# Patient Record
Sex: Male | Born: 1970 | Race: Black or African American | Hispanic: No | Marital: Married | Smoking: Current every day smoker
Health system: Southern US, Community
[De-identification: ages and names within clinical notes are randomized; demographics above are authoritative.]

---

## 2004-10-30 ENCOUNTER — Emergency Department: Payer: Self-pay | Admitting: Unknown Physician Specialty

## 2004-11-26 ENCOUNTER — Emergency Department: Payer: Self-pay | Admitting: Emergency Medicine

## 2005-08-06 ENCOUNTER — Emergency Department: Payer: Self-pay | Admitting: Emergency Medicine

## 2007-07-25 ENCOUNTER — Emergency Department: Payer: Self-pay | Admitting: Emergency Medicine

## 2008-12-15 ENCOUNTER — Emergency Department: Payer: Self-pay | Admitting: Emergency Medicine

## 2012-02-11 ENCOUNTER — Emergency Department: Payer: Self-pay | Admitting: Emergency Medicine

## 2014-08-21 ENCOUNTER — Emergency Department: Payer: Self-pay | Admitting: Emergency Medicine

## 2019-02-13 ENCOUNTER — Other Ambulatory Visit: Payer: Self-pay

## 2019-02-13 ENCOUNTER — Emergency Department: Payer: BLUE CROSS/BLUE SHIELD

## 2019-02-13 ENCOUNTER — Emergency Department
Admission: EM | Admit: 2019-02-13 | Discharge: 2019-02-13 | Disposition: A | Discharge status: Home / Self Care | Payer: BLUE CROSS/BLUE SHIELD | Attending: Emergency Medicine | Admitting: Emergency Medicine

## 2019-02-13 DIAGNOSIS — K2970 Gastritis, unspecified, without bleeding: Secondary | ICD-10-CM | POA: Diagnosis not present

## 2019-02-13 DIAGNOSIS — R1013 Epigastric pain: Secondary | ICD-10-CM | POA: Diagnosis present

## 2019-02-13 LAB — COMPREHENSIVE METABOLIC PANEL
ALT: 42 U/L (ref 0–44)
AST: 25 U/L (ref 15–41)
Albumin: 4 g/dL (ref 3.5–5.0)
Alkaline Phosphatase: 59 U/L (ref 38–126)
Anion gap: 7 (ref 5–15)
BUN: 18 mg/dL (ref 6–20)
CO2: 25 mmol/L (ref 22–32)
Calcium: 9.1 mg/dL (ref 8.9–10.3)
Chloride: 108 mmol/L (ref 98–111)
Creatinine, Ser: 0.95 mg/dL (ref 0.61–1.24)
GFR calc Af Amer: >60 mL/min (ref >60)
GFR calc non Af Amer: >60 mL/min (ref >60)
Glucose, Bld: 177 mg/dL — ABNORMAL HIGH (ref 70–99)
POTASSIUM: 3.7 mmol/L (ref 3.5–5.1)
SODIUM: 140 mmol/L (ref 135–145)
Total Bilirubin: 0.7 mg/dL (ref 0.3–1.2)
Total Protein: 7.5 g/dL (ref 6.5–8.1)

## 2019-02-13 LAB — CBC WITH DIFFERENTIAL/PLATELET
Abs Immature Granulocytes: 0.21 10*3/uL — ABNORMAL HIGH (ref 0.00–0.07)
Basophils Absolute: 0.1 10*3/uL (ref 0.0–0.1)
Basophils Relative: 1 %
Eosinophils Absolute: 0.5 10*3/uL (ref 0.0–0.5)
Eosinophils Relative: 4 %
HCT: 49 % (ref 39.0–52.0)
Hemoglobin: 16 g/dL (ref 13.0–17.0)
Immature Granulocytes: 1 %
Lymphocytes Relative: 36 %
Lymphs Abs: 5.4 10*3/uL — ABNORMAL HIGH (ref 0.7–4.0)
MCH: 29.6 pg (ref 26.0–34.0)
MCHC: 32.7 g/dL (ref 30.0–36.0)
MCV: 90.6 fL (ref 80.0–100.0)
Monocytes Absolute: 0.9 10*3/uL (ref 0.1–1.0)
Monocytes Relative: 6 %
NEUTROS ABS: 8.1 10*3/uL — AB (ref 1.7–7.7)
Neutrophils Relative %: 52 %
Platelets: 185 10*3/uL (ref 150–400)
RBC: 5.41 MIL/uL (ref 4.22–5.81)
RDW: 13.9 % (ref 11.5–15.5)
WBC: 15.1 10*3/uL — ABNORMAL HIGH (ref 4.0–10.5)
nRBC: 0 % (ref 0.0–0.2)

## 2019-02-13 LAB — LIPASE, BLOOD: Lipase: 24 U/L (ref 11–51)

## 2019-02-13 LAB — ETHANOL: Alcohol, Ethyl (B): <10 mg/dL (ref <10)

## 2019-02-13 MED ORDER — SUCRALFATE 1 G PO TABS: 1.0000 g | ORAL_TABLET | Freq: Four times a day (QID) | ORAL | 1 refills | Status: DC

## 2019-02-13 MED ORDER — IOHEXOL 300 MG/ML  SOLN
100.0000 mL | Freq: Once | INTRAMUSCULAR | Status: AC | PRN
  Administered 2019-02-13: 100 mL via INTRAVENOUS

## 2019-02-13 MED ORDER — HYDROMORPHONE HCL 1 MG/ML IJ SOLN
1.0000 mg | Freq: Once | INTRAMUSCULAR | Status: AC
  Administered 2019-02-13: 1 mg via INTRAVENOUS
  Filled 2019-02-13: qty 1

## 2019-02-13 MED ORDER — FAMOTIDINE IN NACL 20-0.9 MG/50ML-% IV SOLN
20.0000 mg | Freq: Once | INTRAVENOUS | Status: AC
  Administered 2019-02-13: 20 mg via INTRAVENOUS
  Filled 2019-02-13: qty 50

## 2019-02-13 MED ORDER — OXYCODONE HCL 5 MG PO TABS: 5.0000 mg | ORAL_TABLET | Freq: Three times a day (TID) | ORAL | 0 refills | Status: AC | PRN

## 2019-02-13 MED ORDER — FAMOTIDINE 20 MG PO TABS: 20.0000 mg | ORAL_TABLET | Freq: Two times a day (BID) | ORAL | 1 refills | Status: DC

## 2019-02-13 MED ORDER — HYDROMORPHONE HCL 1 MG/ML IJ SOLN
0.5000 mg | Freq: Once | INTRAMUSCULAR | Status: AC
  Administered 2019-02-13: 0.5 mg via INTRAVENOUS
  Filled 2019-02-13: qty 1

## 2019-02-13 MED ORDER — SODIUM CHLORIDE 0.9 % IV BOLUS
1000.0000 mL | Freq: Once | INTRAVENOUS | Status: AC
  Administered 2019-02-13: 1000 mL via INTRAVENOUS

## 2019-02-13 MED ORDER — ONDANSETRON HCL 4 MG/2ML IJ SOLN
4.0000 mg | Freq: Once | INTRAMUSCULAR | Status: AC
  Administered 2019-02-13: 4 mg via INTRAVENOUS
  Filled 2019-02-13: qty 2

## 2019-02-13 NOTE — ED Provider Notes (Signed)
[facility] Emergency Department Provider Note       Time seen: ----------------------------------------- 7:06 AM on 02/13/2019 -----------------------------------------   I have reviewed the triage vital signs and the nursing notes.  HISTORY   Chief Complaint Abdominal Pain    HPI Mitchell Mays is a 48 y.o. male with no known past medical history who presents to the ED for epigastric pain that radiates to the mid abdomen into the back.  Patient reports that started at 4 AM this morning.  He has history of same but has not been evaluated for it.  He denies fevers or chills, has had some vomiting.  No past medical history on file.  There are no active problems to display for this patient.  Allergies Patient has no allergy information on record.  Social History Social History   Tobacco Use  . Smoking status: Not on file  Substance Use Topics  . Alcohol use: Not on file  . Drug use: Not on file    Review of Systems Constitutional: Negative for fever. Cardiovascular: Negative for chest pain. Respiratory: Negative for shortness of breath. Gastrointestinal: Positive for abdominal pain Musculoskeletal: Positive for back pain Skin: Negative for rash. Neurological: Negative for headaches, focal weakness or numbness.  All systems negative/normal/unremarkable except as stated in the HPI  ____________________________________________   PHYSICAL EXAM:  VITAL SIGNS: ED Triage Vitals [02/13/19 0603]  Enc Vitals Group     BP (!) 167/96     Pulse Rate 72     Resp 20     Temp 97.8 F (36.6 C)     Temp Source Oral     SpO2 98 %     Weight 215 lb (97.5 kg)     Height 5\' 9"  (1.753 m)     Head Circumference      Peak Flow      Pain Score 8     Pain Loc      Pain Edu?      Excl. in [place]?    Constitutional: Alert and oriented.  Mild distress from pain Eyes: Conjunctivae are normal. Normal extraocular movements. ENT      Head: Normocephalic and  atraumatic.      Nose: No congestion/rhinnorhea.      Mouth/Throat: Mucous membranes are moist.      Neck: No stridor. Cardiovascular: Normal rate, regular rhythm. No murmurs, rubs, or gallops. Respiratory: Normal respiratory effort without tachypnea nor retractions. Breath sounds are clear and equal bilaterally. No wheezes/rales/rhonchi. Gastrointestinal: Distended, nnfocal tenderness.  Normal bowel sounds Musculoskeletal: Nontender with normal range of motion in extremities. No lower extremity tenderness nor edema. Neurologic:  Normal speech and language. No gross focal neurologic deficits are appreciated.  Skin:  Skin is warm, dry and intact. No rash noted. Psychiatric: Mood and affect are normal. Speech and behavior are normal.  ____________________________________________  EKG: Interpreted by me.  Sinus rhythm with rate of 64 bpm, normal PR interval, normal QRS, normal QT  ____________________________________________  ED COURSE:  As part of my medical decision making, I reviewed the following data within the electronic MEDICAL RECORD NUMBER History obtained from family if available, nursing notes, old chart and ekg, as well as notes from prior ED visits. Patient presented for abdominal pain, we will assess with labs and imaging as indicated at this time.   Procedures ____________________________________________   LABS (pertinent positives/negatives)  Labs Reviewed  COMPREHENSIVE METABOLIC PANEL - Abnormal; Notable for the following components:      Result Value  Glucose, Bld 177 (*)    All other components within normal limits  CBC WITH DIFFERENTIAL/PLATELET - Abnormal; Notable for the following components:   WBC 15.1 (*)    Neutro Abs 8.1 (*)    Lymphs Abs 5.4 (*)    Abs Immature Granulocytes 0.21 (*)    All other components within normal limits  ETHANOL  LIPASE, BLOOD    RADIOLOGY Images were viewed by me  CT the abdomen pelvis with contrast IMPRESSION: Distal  esophageal wall thickening likely related to reflux disease.  Vague area of decreased attenuation in the left lobe of the liver. Ultrasound is recommended for further evaluation. ____________________________________________   DIFFERENTIAL DIAGNOSIS   Pancreatitis, gastritis, GERD, peptic ulcer disease, dehydration  FINAL ASSESSMENT AND PLAN  Gastritis   Plan: The patient had presented for abdominal pain. Patient's labs did reveal some leukocytosis but is otherwise unremarkable. Patient's imaging was negative except for some distal esophageal thickening and a nonspecific lesion appreciated in his liver.  His symptoms and presentation are likely suggestive of alcohol induced gastritis.  He will be placed on antacids and Carafate.  He will be referred to GI for outpatient follow-up.   Ulice Dash, MD    Note: This note was generated in part or whole with voice recognition software. Voice recognition is usually quite accurate but there are transcription errors that can and very often do occur. I apologize for any typographical errors that were not detected and corrected.     Emily Filbert, MD 02/13/19 1023

## 2019-02-13 NOTE — ED Notes (Signed)
Patient discharged home with significant other. Medications and discharge instructions reviewed. Patient ambulatory.

## 2019-02-13 NOTE — ED Triage Notes (Signed)
Pt in with co epigastric pain radiates to mid abd and to back. Pt states started at 0400 this am, hx of the same but has not been evaluated for it.

## 2019-02-20 ENCOUNTER — Ambulatory Visit: Payer: BLUE CROSS/BLUE SHIELD | Admitting: Gastroenterology

## 2019-03-06 ENCOUNTER — Ambulatory Visit (INDEPENDENT_AMBULATORY_CARE_PROVIDER_SITE_OTHER): Payer: BLUE CROSS/BLUE SHIELD | Admitting: Gastroenterology

## 2019-03-06 ENCOUNTER — Encounter: Payer: Self-pay | Admitting: Gastroenterology

## 2019-03-06 ENCOUNTER — Other Ambulatory Visit: Payer: Self-pay

## 2019-03-06 DIAGNOSIS — K219 Gastro-esophageal reflux disease without esophagitis: Secondary | ICD-10-CM | POA: Diagnosis not present

## 2019-03-06 DIAGNOSIS — K769 Liver disease, unspecified: Secondary | ICD-10-CM

## 2019-03-06 DIAGNOSIS — R935 Abnormal findings on diagnostic imaging of other abdominal regions, including retroperitoneum: Secondary | ICD-10-CM | POA: Diagnosis not present

## 2019-03-06 NOTE — Progress Notes (Signed)
Patient ID: Mitchell Mays, male   DOB: 04-Jan-1971, 48 y.o.   MRN: [id number] Pt notified of MRI scheduled for 04/10/19 at the [facility] at 9:30 am, arrival time 9:00 am. Nothing to eat or drink 4 hrs before. Recall letter sent for 3-4 months to contact office to schedule his EGD for abnormal CT and colonoscopy for screening. Given PCP's to contact: [facility] in [place]. GERD handout will need to be mailed at a later date. Pt does not have My Chart.

## 2019-03-06 NOTE — Progress Notes (Signed)
Mitchell Mays [address]  [address]  [place], [place] [address]  Main: [phone]  Fax: [phone]   Gastroenterology Consultation  Referring Provider:     Daryel November Primary Care Physician:  Patient, No Pcp Per Reason for Consultation:     Epigastric pain        HPI:   Virtual Visit via Video Note  I connected with patient on 03/06/19 at  9:00 AM EDT by video ([email or web address]) and verified that I am speaking with the correct person using two identifiers.   I discussed the limitations, risks, security and privacy concerns of performing an evaluation and management service by video and the availability of in person appointments. I also discussed with the patient that there may be a patient responsible charge related to this service. The patient expressed understanding and agreed to proceed.  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 y/o male referred for abdominal pain.  Patient reports midepigastric pain that led him to the ER a few weeks ago.  He was given Pepcid and Carafate and has been taking this and states his abdominal pain has completely gone away.  He had some associated nausea during that time but no vomiting.  No altered bowel habits or blood in stool.  Denies any dysphagia associated with the episode.  Denies any heartburn.  No family history of cancer.  States the pain has not reoccurred in the last 1 week at least.  No weight loss.  No prior EGD or colonoscopy.     Past medical history: None  Prior to Admission medications   Medication Sig Start Date End Date Taking? Authorizing Provider  famotidine (PEPCID) 20 MG tablet Take 1 tablet (20 mg total) by mouth 2 (two) times daily. 02/13/19  Yes Emily Filbert, MD  oxyCODONE (ROXICODONE) 5 MG immediate release tablet Take 1  tablet (5 mg total) by mouth every 8 (eight) hours as needed. 02/13/19 02/13/20 Yes Emily Filbert, MD  sucralfate (CARAFATE) 1 g tablet Take 1 tablet (1 g total) by mouth 4 (four) times daily. 02/13/19 02/13/20 Yes Emily Filbert, MD    Family History  Problem Relation Age of Onset   Diabetes Mother    Diabetes Father    Liver cancer Father    Diabetes Maternal Grandmother    Diabetes Maternal Grandfather      Social History   Tobacco Use   Smoking status: Current Every Day Smoker    Types: Cigarettes   Smokeless tobacco: Never Used  Substance Use Topics   Alcohol use: Yes    Comment: occ   Drug use: Never    Allergies as of 03/06/2019   (No Known Allergies)    Review of Systems:    All systems reviewed and negative except where noted in HPI.   Observations/Objective:  Labs: CBC    Component Value Date/Time   WBC 15.1 (H) 02/13/2019 0642   RBC 5.41 02/13/2019 0642   HGB 16.0 02/13/2019 0642   HCT 49.0 02/13/2019 0642   PLT 185 02/13/2019 0642   MCV 90.6 02/13/2019 0642   MCH 29.6 02/13/2019 0642   MCHC 32.7 02/13/2019 0642   RDW 13.9 02/13/2019 0642   LYMPHSABS 5.4 (H) 02/13/2019 0642   MONOABS 0.9 02/13/2019 0642   EOSABS 0.5 02/13/2019 4562  BASOSABS 0.1 02/13/2019 0642   CMP     Component Value Date/Time   NA 140 02/13/2019 0642   K 3.7 02/13/2019 0642   CL 108 02/13/2019 0642   CO2 25 02/13/2019 0642   GLUCOSE 177 (H) 02/13/2019 0642   BUN 18 02/13/2019 0642   CREATININE 0.95 02/13/2019 0642   CALCIUM 9.1 02/13/2019 0642   PROT 7.5 02/13/2019 0642   ALBUMIN 4.0 02/13/2019 0642   AST 25 02/13/2019 0642   ALT 42 02/13/2019 0642   ALKPHOS 59 02/13/2019 0642   BILITOT 0.7 02/13/2019 0642   GFRNONAA >60 02/13/2019 0642   GFRAA >60 02/13/2019 0642    Imaging Studies: Ct Abdomen Pelvis W Contrast  Result Date: 02/13/2019 CLINICAL DATA:  Epigastric pain for several hours EXAM: CT ABDOMEN AND PELVIS WITH CONTRAST TECHNIQUE:  Multidetector CT imaging of the abdomen and pelvis was performed using the standard protocol following bolus administration of intravenous contrast. CONTRAST:  OMNIPAQUE IOHEXOL 300 MG/ML  SOLN COMPARISON:  None. FINDINGS: Lower chest: No acute abnormality. Hepatobiliary: The gallbladder is well distended without focal abnormality. The liver demonstrates a vague area of decreased attenuation in the lateral segment of the left lobe best seen on image number 17 of series 2. This area measures approximately 2.9 cm in greatest dimension. Pancreas: Unremarkable. No pancreatic ductal dilatation or surrounding inflammatory changes. Spleen: Normal in size without focal abnormality. Adrenals/Urinary Tract: Adrenal glands are within normal limits. Kidneys demonstrate a normal enhancement pattern bilaterally. No renal calculi or obstructive changes are seen. Normal excretion of contrast material is noted bilaterally. The bladder is partially distended. Stomach/Bowel: The appendix is within normal limits. No obstructive or inflammatory changes of large or small bowel are noted. Some thickening of the distal esophageal wall is noted likely related to reflux disease. Vascular/Lymphatic: No significant vascular findings are present. No enlarged abdominal or pelvic lymph nodes. Reproductive: Prostate is unremarkable. Other: No abdominal wall hernia or abnormality. No abdominopelvic ascites. Musculoskeletal: No acute or significant osseous findings. IMPRESSION: Distal esophageal wall thickening likely related to reflux disease. Vague area of decreased attenuation in the left lobe of the liver. Ultrasound is recommended for further evaluation. Electronically Signed   By: Alcide Clever M.D.   On: 02/13/2019 08:04   US Abdomen Limited Ruq  Result Date: 02/13/2019 CLINICAL DATA:  Possible hepatic mass on recent CT EXAM: ULTRASOUND ABDOMEN LIMITED RIGHT UPPER QUADRANT COMPARISON:  None. FINDINGS: Gallbladder: Gallbladder is well  distended. 4 mm gallbladder polyp is noted. Additionally a large calculus is noted. No wall thickening or pericholecystic fluid is noted. Common bile duct: Diameter: 3.8 mm. Liver: Some vague increased echogenicity is noted within the left lobe of the liver in the generalized area of the abnormality seen on recent CT. No discrete mass is noted. Portal vein is patent on color Doppler imaging with normal direction of blood flow towards the liver. IMPRESSION: Cholelithiasis and gallbladder polyps. Previously seen hyperdense lesion on CT is not as well appreciated on today's ultrasound. Nonemergent MRI is recommended for further evaluation. Electronically Signed   By: Alcide Clever M.D.   On: 02/13/2019 10:15    Assessment and Plan:   Mitchell Mays is a 48 y.o. y/o male has been referred for epigastric pain  Assessment and Plan: The pain has completely resolved with PPI and Carafate Etiology of the pain was likely GERD  However, we did discuss that the CT reported esophageal thickening which according to the radiologist may be related to reflux.  The resolution of the pain with H2 RA is reassuring and most consistent with GERD.  However, an upper endoscopy would help evaluate for and rule out any underlying lesion such as cancers or mass in the esophagus correlating to the CT findings.  In addition, he is also due for screening colonoscopy.  Imaging including CT and ultrasound also reported a vague liver lesion.  His liver enzymes are completely normal.  This is likely benign, but since an MRI is recommended, we will order and schedule this.  This was discussed with the patient as well.  Due to the current COVID-19 situation, elective procedures are currently being scheduled for a later time as per nationwide recommendations. Therefore, the patient was informed of the need to schedule the procedure in the upcoming months, instead of sooner, since this is an elective procedure. Patient will need to be  contacted at a later time to place him/her on the schedule. However, alarm symptoms were discussed in detail, and if these occur pt was advised to call us to discuss change in symptoms and evaluation for a more urgent procedure if appropriate. No indication for urgent procedure exist at this time. Patient was given the contact information to reach [place] with any questions, concerns, or change in symptoms.   I have discussed alternative options, risks & benefits,  which include, but are not limited to, bleeding, infection, perforation,respiratory complication & drug reaction.  The patient agrees with this plan & written consent will be obtained.    Patient is to continue and complete his prescribed course of Pepcid.  If symptoms return after stopping the medication he is to contact us and he verbalized understanding.  Follow Up Instructions: EGD and colonoscopy in the next 3 to 4 months once schedules are back to normal  Clinic follow-up in 3 to 4 months  Patient advised to establish primary care provider as soon as possible  GERD lifestyle modifications also sent to patient and discussed.  I discussed the assessment and treatment plan with the patient. The patient was provided an opportunity to ask questions and all were answered. The patient agreed with the plan and demonstrated an understanding of the instructions.   The patient was advised to call back or seek an in-person evaluation if the symptoms worsen or if the condition fails to improve as anticipated.  I provided 10 minutes of non-face-to-face time during this encounter.   Pasty Spillers, MD  Speech recognition software was used to dictate the above note.

## 2019-03-06 NOTE — Patient Instructions (Signed)

## 2019-03-13 ENCOUNTER — Telehealth: Payer: Self-pay

## 2019-03-13 NOTE — Telephone Encounter (Signed)
GERD information mailed to patient.

## 2019-04-04 ENCOUNTER — Telehealth: Payer: Self-pay

## 2019-04-04 DIAGNOSIS — K769 Liver disease, unspecified: Secondary | ICD-10-CM

## 2019-04-04 NOTE — Telephone Encounter (Signed)
-----   Message from Rayann Heman, [place] sent at 04/03/2019  3:07 PM EDT ----- Justice [place]!!   This pt is scheduled for an MRI next week. I went to do his prior authorization and [facility] is out of network for his insurance. He has the option to go to [facility] radiology or [facility]. [address] [place], [place]. Their number is [phone]. I will wait to finish the prior authorization when I hear back from you.  Whether he wants to go to either one of those places or continue with [facility] and have out of network fees. I did message Maximino Greenland and she said do whatever we need to do.   If you reschedule it, let me know.

## 2019-04-04 NOTE — Telephone Encounter (Signed)
Pt notified that [facility] is out of network for his insurance and he opted to go to [facility] in [place]. I contacted [facility] in [place] and they will contact pt and schedule appt. Notified [facility] to cancel previous CT scheduled for 04/10/19. Pt aware [facility] will be calling him to make the appt for MRI and the MRI at [facility] has been canceled.

## 2019-04-10 ENCOUNTER — Ambulatory Visit: Admission: RE | Admit: 2019-04-10 | Payer: BLUE CROSS/BLUE SHIELD | Source: Ambulatory Visit

## 2019-04-10 ENCOUNTER — Ambulatory Visit: Payer: BLUE CROSS/BLUE SHIELD

## 2019-04-18 ENCOUNTER — Other Ambulatory Visit: Payer: BLUE CROSS/BLUE SHIELD

## 2019-05-16 ENCOUNTER — Emergency Department
Admission: EM | Admit: 2019-05-16 | Discharge: 2019-05-16 | Disposition: A | Discharge status: Home / Self Care | Payer: BC Managed Care – PPO | Attending: Emergency Medicine | Admitting: Emergency Medicine

## 2019-05-16 ENCOUNTER — Other Ambulatory Visit: Payer: Self-pay

## 2019-05-16 ENCOUNTER — Encounter: Payer: Self-pay | Admitting: Emergency Medicine

## 2019-05-16 DIAGNOSIS — F17210 Nicotine dependence, cigarettes, uncomplicated: Secondary | ICD-10-CM | POA: Diagnosis not present

## 2019-05-16 DIAGNOSIS — K2970 Gastritis, unspecified, without bleeding: Secondary | ICD-10-CM | POA: Insufficient documentation

## 2019-05-16 DIAGNOSIS — R1010 Upper abdominal pain, unspecified: Secondary | ICD-10-CM | POA: Diagnosis present

## 2019-05-16 LAB — URINALYSIS, COMPLETE (UACMP) WITH MICROSCOPIC
Bacteria, UA: NONE SEEN
Bilirubin Urine: NEGATIVE
Glucose, UA: NEGATIVE mg/dL
Hgb urine dipstick: NEGATIVE
Ketones, ur: NEGATIVE mg/dL
Leukocytes,Ua: NEGATIVE
Nitrite: NEGATIVE
Protein, ur: 30 mg/dL — AB
Specific Gravity, Urine: 1.021 (ref 1.005–1.030)
pH: 5 (ref 5.0–8.0)

## 2019-05-16 LAB — CBC WITH DIFFERENTIAL/PLATELET
Abs Immature Granulocytes: 0.06 10*3/uL (ref 0.00–0.07)
Basophils Absolute: 0.1 10*3/uL (ref 0.0–0.1)
Basophils Relative: 1 %
Eosinophils Absolute: 0.5 10*3/uL (ref 0.0–0.5)
Eosinophils Relative: 4 %
HCT: 50.2 % (ref 39.0–52.0)
Hemoglobin: 16.5 g/dL (ref 13.0–17.0)
Immature Granulocytes: 1 %
Lymphocytes Relative: 12 %
Lymphs Abs: 1.5 10*3/uL (ref 0.7–4.0)
MCH: 29.2 pg (ref 26.0–34.0)
MCHC: 32.9 g/dL (ref 30.0–36.0)
MCV: 88.8 fL (ref 80.0–100.0)
Monocytes Absolute: 0.7 10*3/uL (ref 0.1–1.0)
Monocytes Relative: 5 %
Neutro Abs: 10.3 10*3/uL — ABNORMAL HIGH (ref 1.7–7.7)
Neutrophils Relative %: 77 %
Platelets: 185 10*3/uL (ref 150–400)
RBC: 5.65 MIL/uL (ref 4.22–5.81)
RDW: 13.7 % (ref 11.5–15.5)
WBC: 13.2 10*3/uL — ABNORMAL HIGH (ref 4.0–10.5)
nRBC: 0 % (ref 0.0–0.2)

## 2019-05-16 LAB — COMPREHENSIVE METABOLIC PANEL
ALT: 37 U/L (ref 0–44)
AST: 23 U/L (ref 15–41)
Albumin: 4.2 g/dL (ref 3.5–5.0)
Alkaline Phosphatase: 68 U/L (ref 38–126)
Anion gap: 6 (ref 5–15)
BUN: 9 mg/dL (ref 6–20)
CO2: 25 mmol/L (ref 22–32)
Calcium: 9.2 mg/dL (ref 8.9–10.3)
Chloride: 109 mmol/L (ref 98–111)
Creatinine, Ser: 0.84 mg/dL (ref 0.61–1.24)
GFR calc Af Amer: >60 mL/min (ref >60)
GFR calc non Af Amer: >60 mL/min (ref >60)
Glucose, Bld: 157 mg/dL — ABNORMAL HIGH (ref 70–99)
Potassium: 4.1 mmol/L (ref 3.5–5.1)
Sodium: 140 mmol/L (ref 135–145)
Total Bilirubin: 0.7 mg/dL (ref 0.3–1.2)
Total Protein: 7.5 g/dL (ref 6.5–8.1)

## 2019-05-16 LAB — LIPASE, BLOOD: Lipase: 29 U/L (ref 11–51)

## 2019-05-16 LAB — TROPONIN I: Troponin I: <0.03 ng/mL (ref <0.03)

## 2019-05-16 MED ORDER — LIDOCAINE VISCOUS HCL 2 % MT SOLN
15.0000 mL | Freq: Once | OROMUCOSAL | Status: AC
  Administered 2019-05-16: 07:00:00 15 mL via ORAL
  Filled 2019-05-16: qty 15

## 2019-05-16 MED ORDER — FAMOTIDINE 20 MG PO TABS: 20.0000 mg | ORAL_TABLET | Freq: Two times a day (BID) | ORAL | 1 refills | Status: AC

## 2019-05-16 MED ORDER — ALUM & MAG HYDROXIDE-SIMETH 200-200-20 MG/5ML PO SUSP
30.0000 mL | Freq: Once | ORAL | Status: AC
  Administered 2019-05-16: 07:00:00 30 mL via ORAL
  Filled 2019-05-16: qty 30

## 2019-05-16 MED ORDER — ONDANSETRON 4 MG PO TBDP
4.0000 mg | ORAL_TABLET | Freq: Once | ORAL | Status: AC
  Administered 2019-05-16: 08:00:00 4 mg via ORAL
  Filled 2019-05-16: qty 1

## 2019-05-16 MED ORDER — SUCRALFATE 1 G PO TABS: 1.0000 g | ORAL_TABLET | Freq: Four times a day (QID) | ORAL | 1 refills | Status: AC

## 2019-05-16 NOTE — ED Notes (Signed)
Pt c/o upper abd pain that started yesterday with vomiting x1. States he has been dx with ulcer in the past and was taking meds for acid reflux but has not been taking it.

## 2019-05-16 NOTE — Discharge Instructions (Addendum)
Please take medications as prescribed.  Please follow-up with GI medicine by calling the number provided to arrange a follow-up appointment.  Return to the emergency department for any worsening abdominal pain development of fever, or any other symptom personally concerning to yourself.

## 2019-05-16 NOTE — ED Notes (Signed)
Pt vomiting up green bile emesis after given GI cocktail.

## 2019-05-16 NOTE — ED Provider Notes (Signed)
[facility] Emergency Department Provider Note  Time seen: 7:23 AM  I have reviewed the triage vital signs and the nursing notes.   HISTORY  Chief Complaint Abdominal Pain   HPI Mitchell Mays is a 48 y.o. male with no significant past medical history presents to the emergency department for upper abdominal discomfort.  According to the patient since yesterday he has been experiencing 7/10 burning type pain in the upper abdomen.  Patient states he has had similar pains in this area before thought to be due to a gastric ulcer however patient states he has never seen a GI doctor or had an endoscopy.  Patient was last given medication for this several months ago including Pepcid and sucralfate.  Patient states he has not had pain since until he ran out of his medication approximately 1 week ago.  Patient denies any fever, diarrhea, cough congestion or shortness of breath.  Patient did have one episode of vomiting just prior to arrival.  History reviewed. No pertinent past medical history.  There are no active problems to display for this patient.   History reviewed. No pertinent surgical history.  Prior to Admission medications   Medication Sig Start Date End Date Taking? Authorizing Provider  famotidine (PEPCID) 20 MG tablet Take 1 tablet (20 mg total) by mouth 2 (two) times daily. 02/13/19   Earleen Newport, MD  oxyCODONE (ROXICODONE) 5 MG immediate release tablet Take 1 tablet (5 mg total) by mouth every 8 (eight) hours as needed. 02/13/19 02/13/20  Earleen Newport, MD  sucralfate (CARAFATE) 1 g tablet Take 1 tablet (1 g total) by mouth 4 (four) times daily. 02/13/19 02/13/20  Earleen Newport, MD    No Known Allergies  Family History  Problem Relation Age of Onset  . Diabetes Mother   . Diabetes Father   . Liver cancer Father   . Diabetes Maternal Grandmother   . Diabetes Maternal Grandfather     Social History Social History   Tobacco Use   . Smoking status: Current Every Day Smoker    Types: Cigarettes  . Smokeless tobacco: Never Used  Substance Use Topics  . Alcohol use: Yes    Comment: occ  . Drug use: Never    Review of Systems Constitutional: Negative for fever. Cardiovascular: Negative for chest pain. Respiratory: Negative for shortness of breath.  For cough. Gastrointestinal: Positive for upper abdominal discomfort/burning.  One episode of vomiting.  Negative for diarrhea.  Negative for black stool. Genitourinary: Negative for urinary compaints Musculoskeletal: Negative for musculoskeletal complaints Skin: Negative for skin complaints  Neurological: Negative for headache All other ROS negative  ____________________________________________   PHYSICAL EXAM:  VITAL SIGNS: ED Triage Vitals [05/16/19 0337]  Enc Vitals Group     BP (!) 160/93     Pulse Rate 73     Resp 18     Temp 97.9 F (36.6 C)     Temp Source Oral     SpO2 98 %     Weight 215 lb (97.5 kg)     Height 5\' 9"  (1.753 m)     Head Circumference      Peak Flow      Pain Score 7     Pain Loc      Pain Edu?      Excl. in [place]?    Constitutional: Alert and oriented. Well appearing and in no distress. Eyes: Normal exam ENT      Head: Normocephalic and atraumatic.  Mouth/Throat: Mucous membranes are moist. Cardiovascular: Normal rate, regular rhythm.  Respiratory: Normal respiratory effort without tachypnea nor retractions. Breath sounds are clear  Gastrointestinal: Soft and nontender. No distention.   Musculoskeletal: Nontender with normal range of motion in all extremities.  Neurologic:  Normal speech and language. No gross focal neurologic deficits Skin:  Skin is warm, dry and intact.  Psychiatric: Mood and affect are normal.   EKG viewed and interpreted by myself shows a normal sinus rhythm at 70 bpm with a narrow QRS, normal axis, normal intervals, no concerning ST  changes ____________________________________________    INITIAL IMPRESSION / ASSESSMENT AND PLAN / ED COURSE  Pertinent labs & imaging results that were available during my care of the patient were reviewed by me and considered in my medical decision making (see chart for details).   Patient presents to the emergency department for upper abdominal discomfort.  Largely benign abdominal exam.  States it feels identical to the pain he had previously several months ago when he was started on sucralfate and Pepcid.  States he ran out of Pepcid 1 week ago and has begun having symptoms once again.  Patient's lab work is largely nonrevealing besides a slight leukocytosis.  LFTs and lipase are normal.  Urine is normal.  Troponin normal.  We will dose a GI cocktail in the emergency department and continue to closely monitor.  Differential this time would include gastric/peptic ulcers, gastritis, gastroenteritis, less likely pancreatitis or gallbladder/liver pathology given normal labs.  Patient spit up the GI cocktail shortly after taking it, states it was very nasty tasting.  Overall patient appears well.  I did offer to proceed with CT imaging the abdomen/pelvis, patient states he feels like this is the same pain he experienced earlier that went away with sucralfate and Pepcid.  We will place the patient back on sucralfate and Pepcid have the patient follow-up with GI medicine.  Provided my normal abdominal pain return precautions.  Mitchell Mays was evaluated in Emergency Department on 05/16/2019 for the symptoms described in the history of present illness. He was evaluated in the context of the global COVID-19 pandemic, which necessitated consideration that the patient might be at risk for infection with the SARS-CoV-2 virus that causes COVID-19. Institutional protocols and algorithms that pertain to the evaluation of patients at risk for COVID-19 are in a state of rapid change based on information released  by regulatory bodies including the CDC and federal and state organizations. These policies and algorithms were followed during the patient's care in the ED.  ____________________________________________   FINAL CLINICAL IMPRESSION(S) / ED DIAGNOSES  Upper abdominal discomfort   Minna AntisPaduchowski, Tamyia Minich, MD 05/16/19 [phone]

## 2019-05-16 NOTE — ED Triage Notes (Signed)
Patient ambulatory to triage with steady gait, without difficulty or distress noted, mask in place; pt reports onset mid upper abd pain; V x 1; st hx of same with dx ulcer

## 2019-07-13 IMAGING — CT CT ABDOMEN AND PELVIS WITH CONTRAST
2 of 5 series · 16 of 46 positions shown, 18 images · IV contrast (omnipaque)
Comparison: None.

CLINICAL DATA: Epigastric pain for several hours

EXAM:
CT ABDOMEN AND PELVIS WITH CONTRAST
TECHNIQUE: Multidetector CT imaging of the abdomen and pelvis was performed
using the standard protocol following bolus administration of
intravenous contrast.
CONTRAST:  100mL OMNIPAQUE IOHEXOL 300 MG/ML  SOLN

[Series 2: routine abd/pel with · axial · 0.73mm/px · z∈[-543,-93]mm · 13 of 100 slices shown, 15 images]
[im 5/100  soft-tissue]
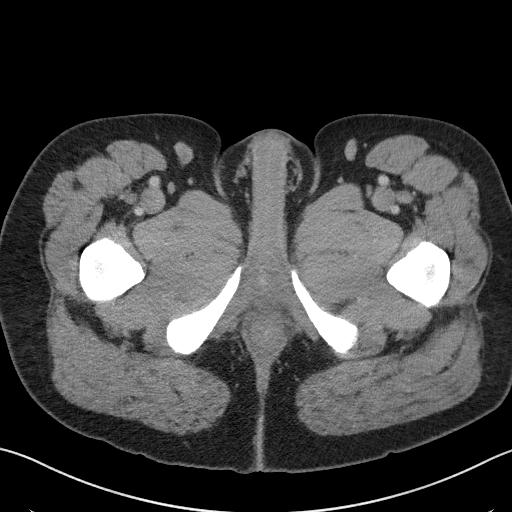
[im 5/100  bone]
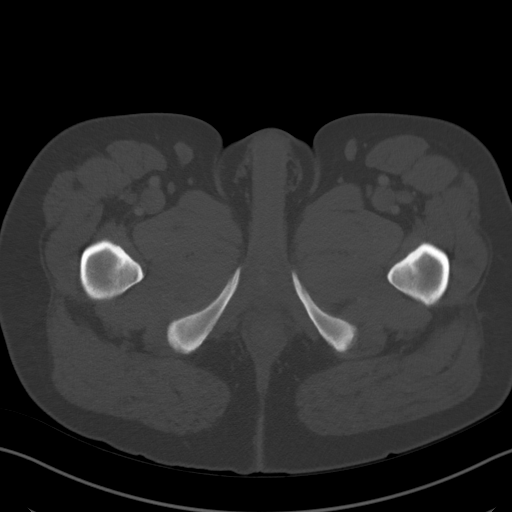
[im 15/100  soft-tissue]
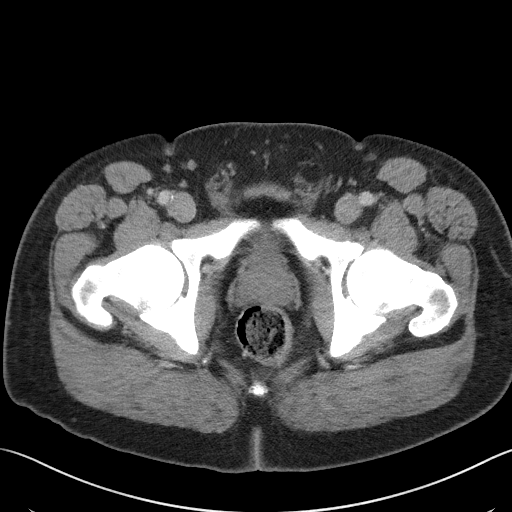
[im 20/100  soft-tissue]
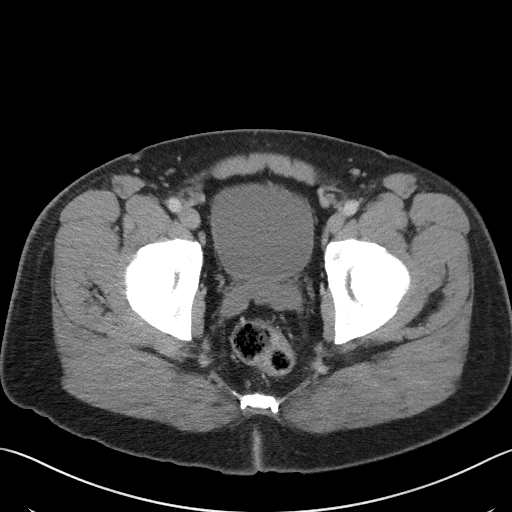
[im 30/100  soft-tissue]
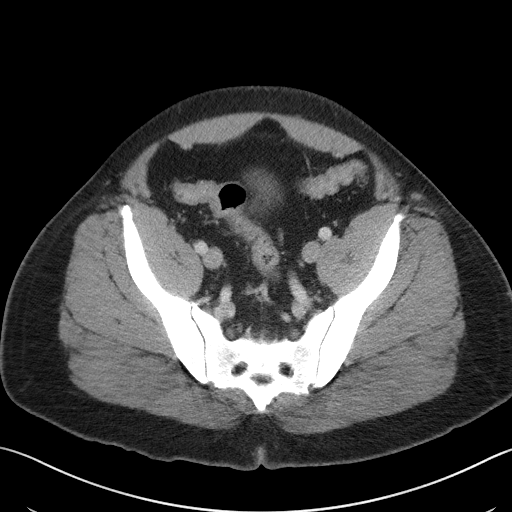
[im 35/100  soft-tissue]
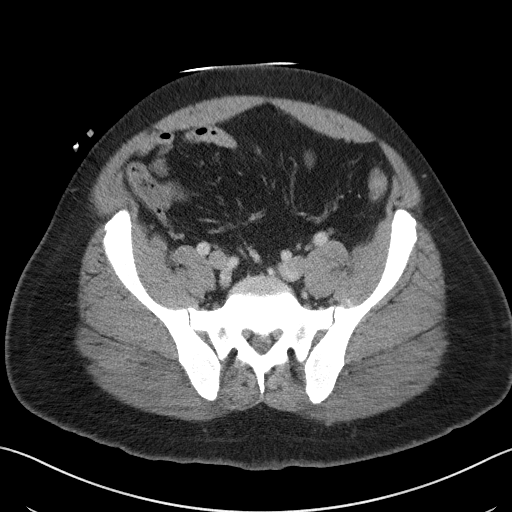
[im 45/100  soft-tissue]
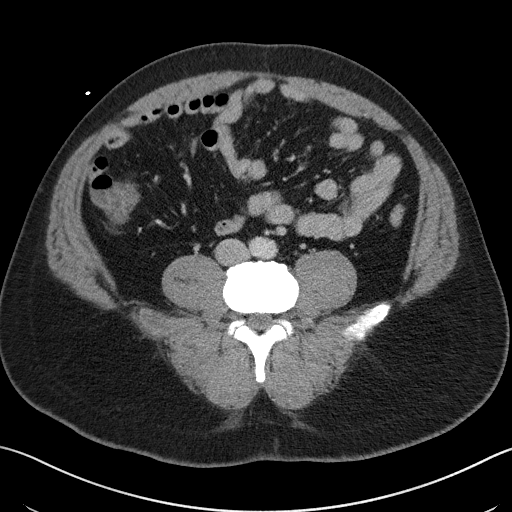
[im 50/100  soft-tissue]
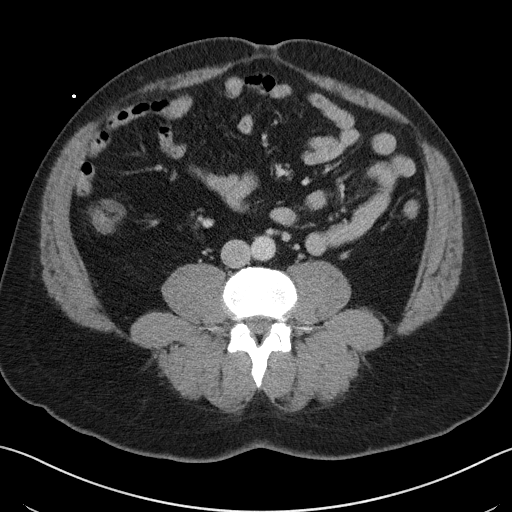
[im 55/100  soft-tissue]
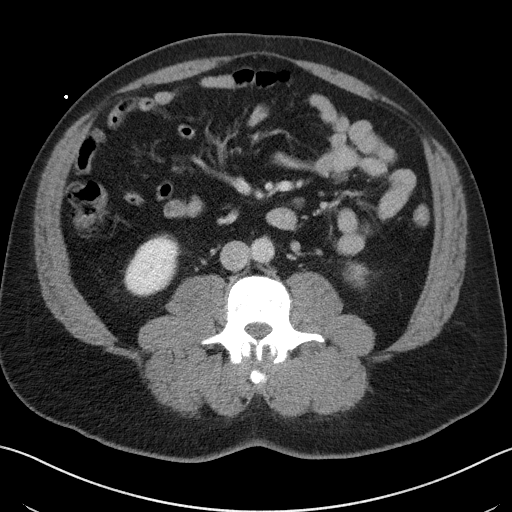
[im 65/100  soft-tissue]
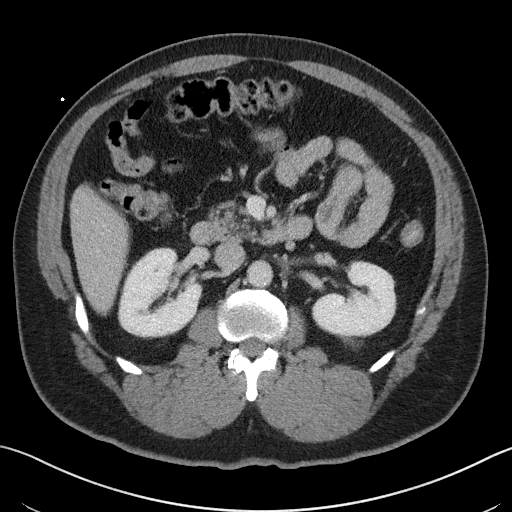
[im 65/100  bone]
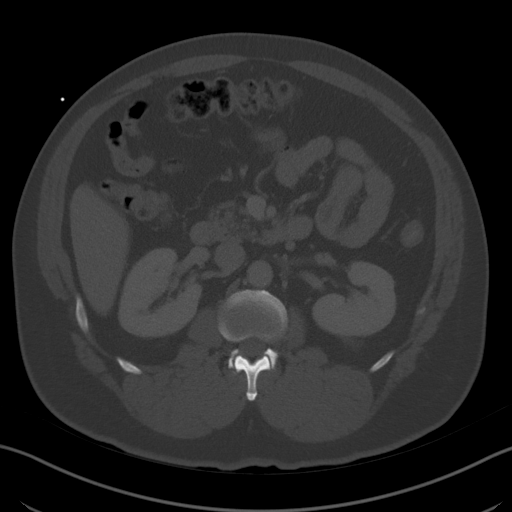
[im 70/100  soft-tissue]
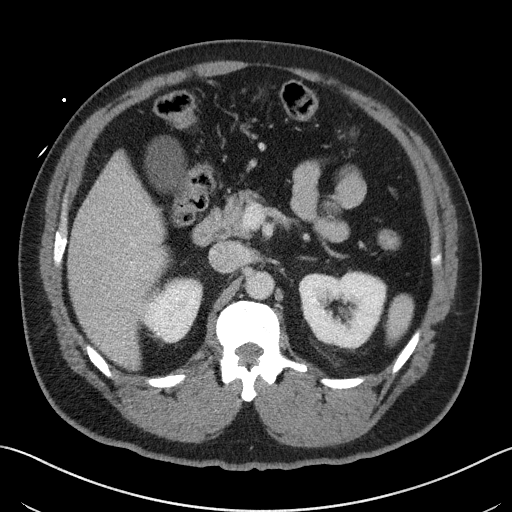
[im 80/100  soft-tissue]
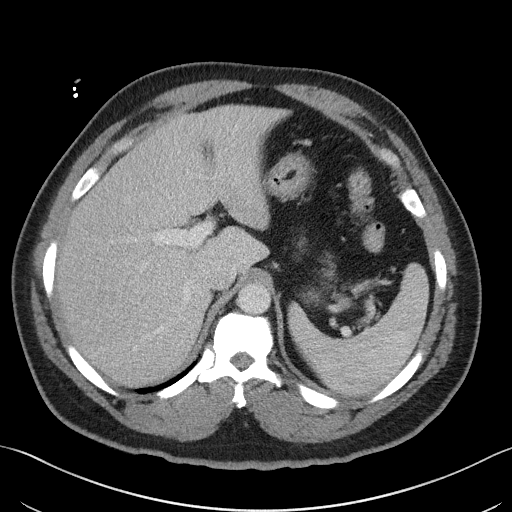
[im 85/100  soft-tissue]
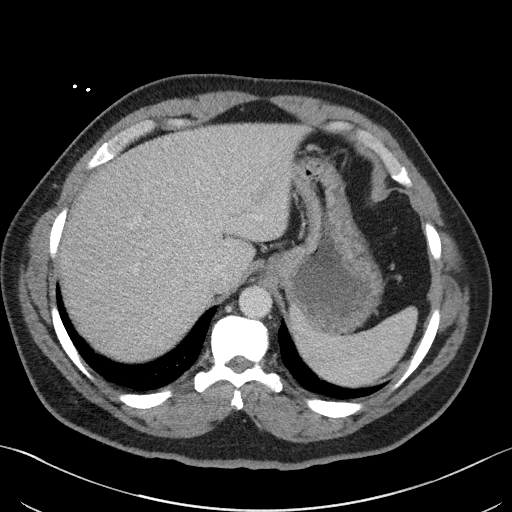
[im 95/100  soft-tissue]
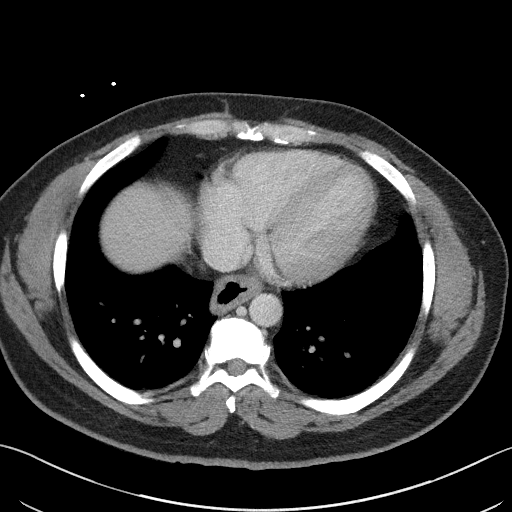

[Series 5: coronal st · coronal · 0.78mm/px · 3 of 106 slices shown]
[im 36/106  soft-tissue]
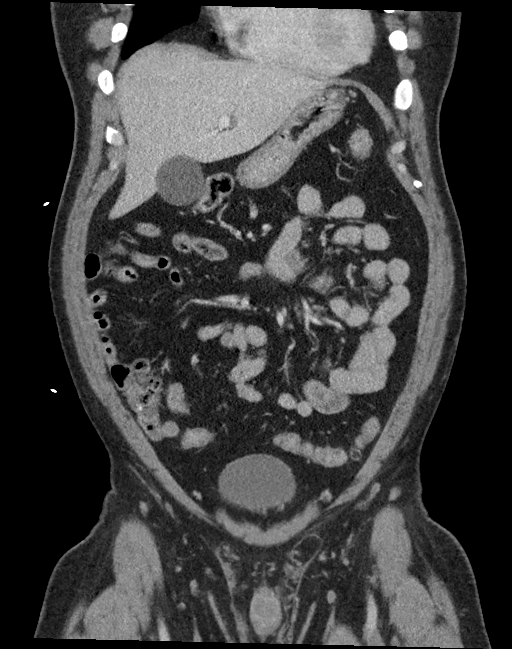
[im 47/106  soft-tissue]
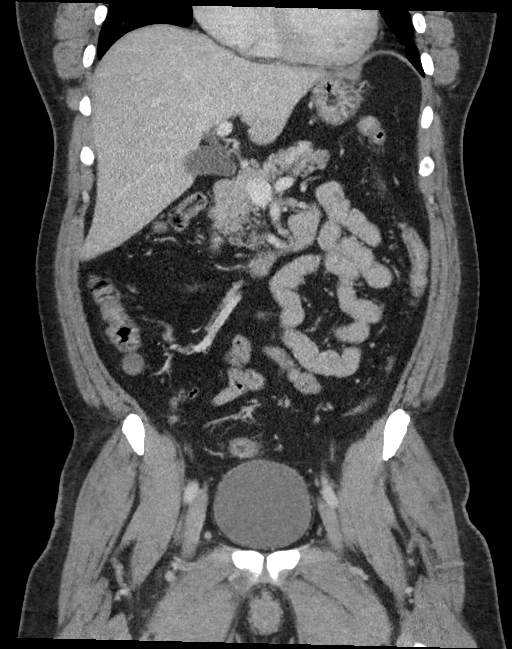
[im 59/106  soft-tissue]
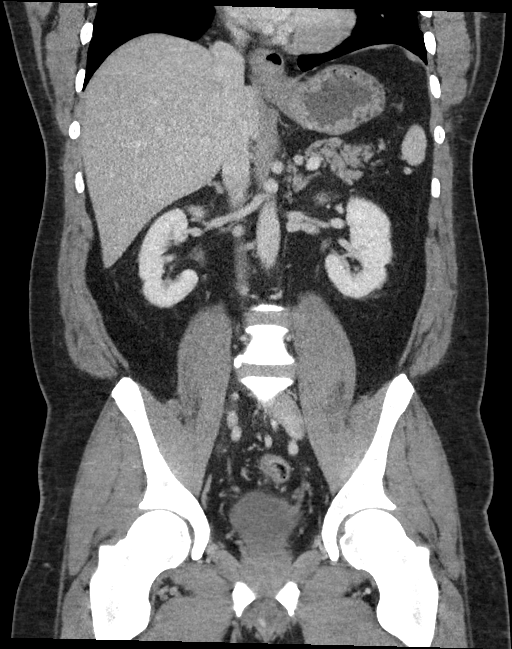

[16 of 46 positions shown; findings below may reference images not displayed]

FINDINGS: Lower chest: No acute abnormality.

Hepatobiliary: The gallbladder is well distended without focal
abnormality. The liver demonstrates a vague area of decreased
attenuation in the lateral segment of the left lobe best seen on
image number 17 of series 2. This area measures approximately 2.9 cm
in greatest dimension.

Pancreas: Unremarkable. No pancreatic ductal dilatation or
surrounding inflammatory changes.

Spleen: Normal in size without focal abnormality.

Adrenals/Urinary Tract: Adrenal glands are within normal limits.
Kidneys demonstrate a normal enhancement pattern bilaterally. No
renal calculi or obstructive changes are seen. Normal excretion of
contrast material is noted bilaterally. The bladder is partially
distended.

Stomach/Bowel: The appendix is within normal limits. No obstructive
or inflammatory changes of large or small bowel are noted. Some
thickening of the distal esophageal wall is noted likely related to
reflux disease.

Vascular/Lymphatic: No significant vascular findings are present. No
enlarged abdominal or pelvic lymph nodes.

Reproductive: Prostate is unremarkable.

Other: No abdominal wall hernia or abnormality. No abdominopelvic
ascites.

Musculoskeletal: No acute or significant osseous findings.
IMPRESSION: Distal esophageal wall thickening likely related to reflux disease.

Vague area of decreased attenuation in the left lobe of the liver.
Ultrasound is recommended for further evaluation.

## 2020-08-14 IMAGING — US ULTRASOUND ABDOMEN LIMITED
1 series · 14 of 25 positions shown · non-contrast
Comparison: None.

CLINICAL DATA: Possible hepatic mass on recent CT

EXAM:
ULTRASOUND ABDOMEN LIMITED RIGHT UPPER QUADRANT

[Series 1: ultrasound abdomen limited · 0.23mm/px · 14 of 113 slices shown]
[im 1/113]
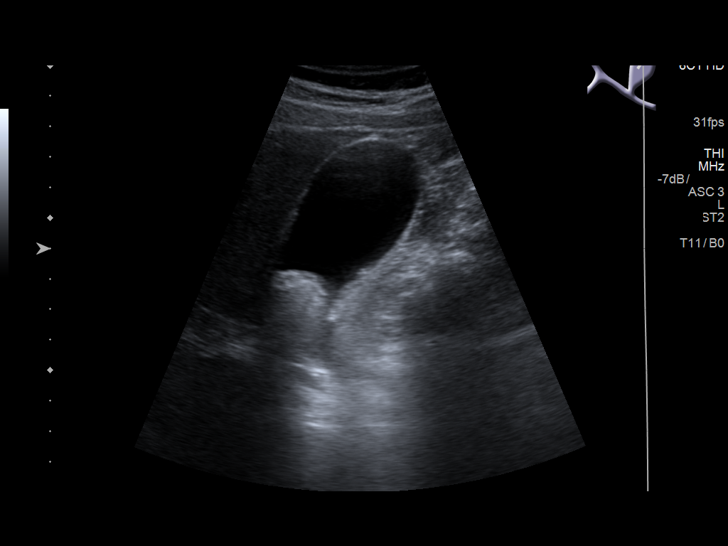
[im 10/113]
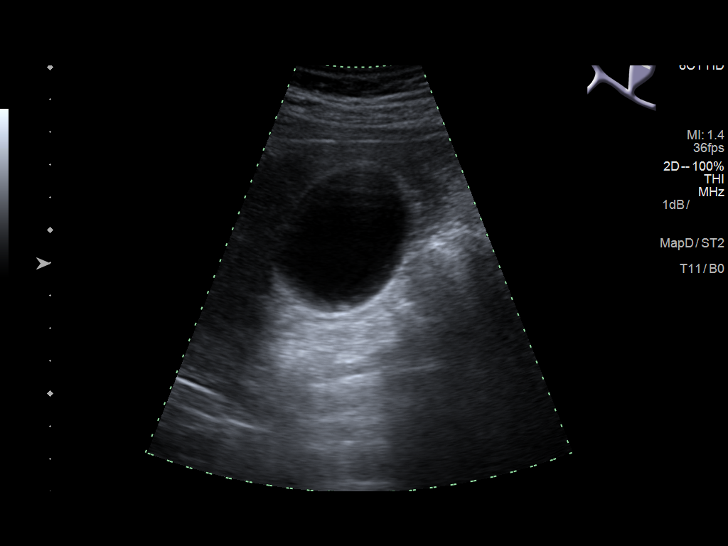
[im 19/113]
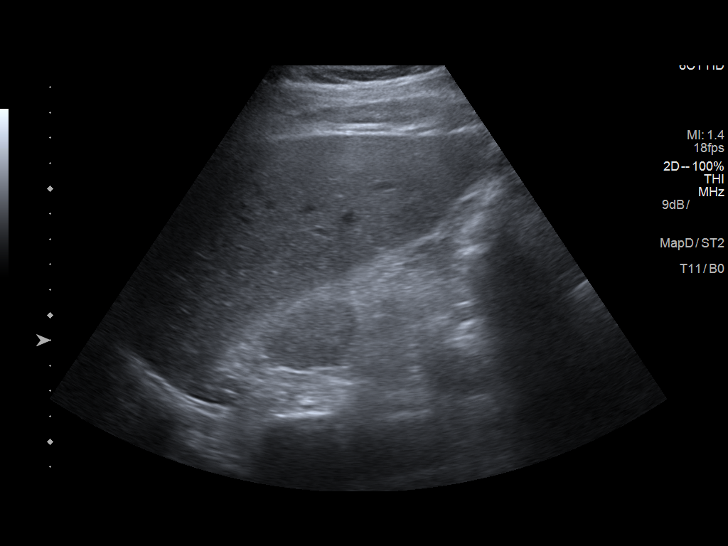
[im 29/113]
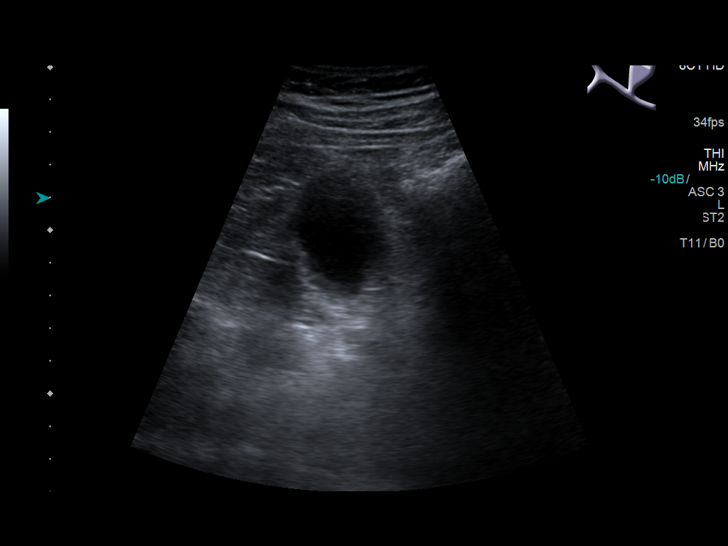
[im 38/113]
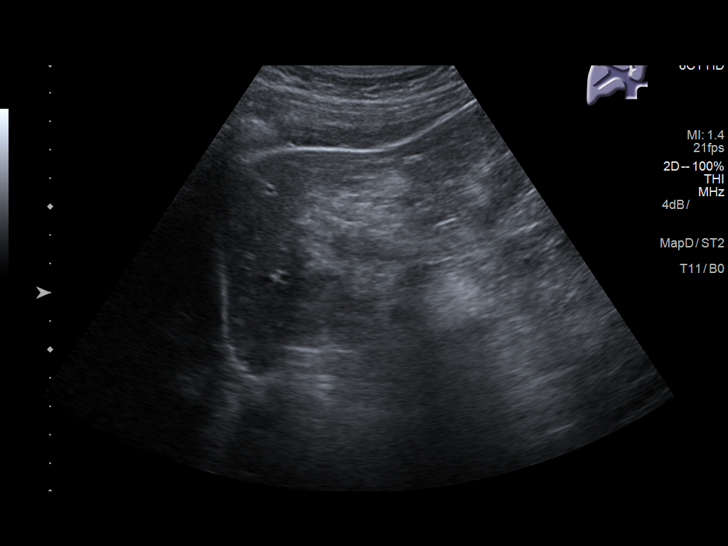
[im 43/113]
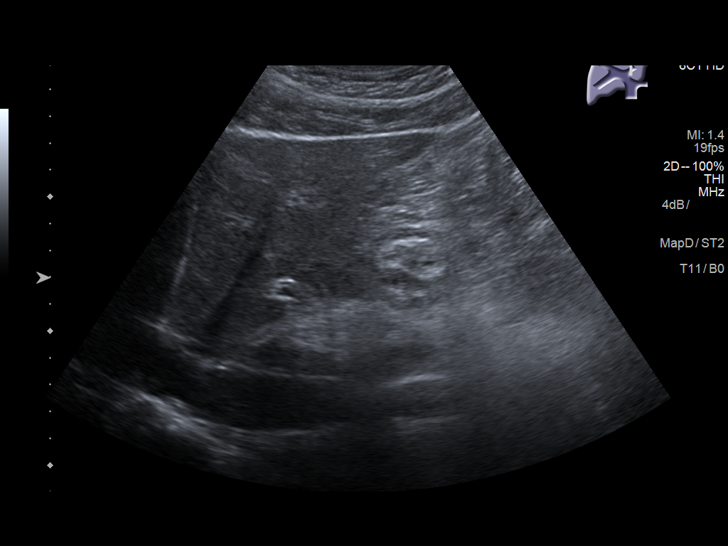
[im 52/113]
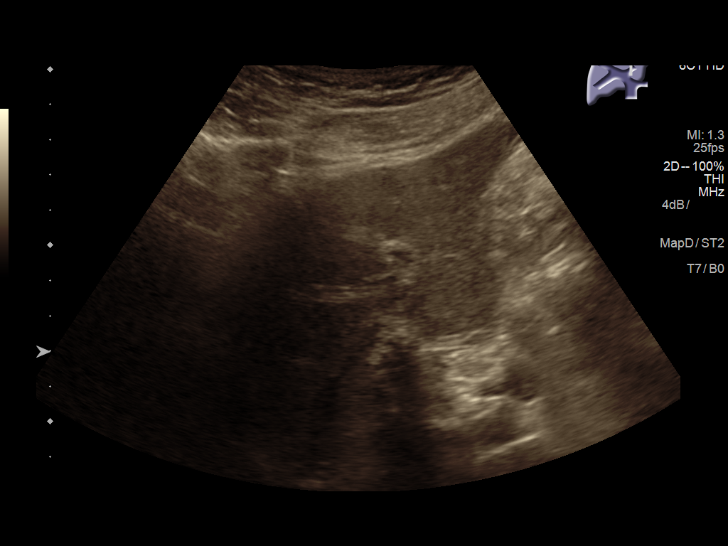
[im 61/113]
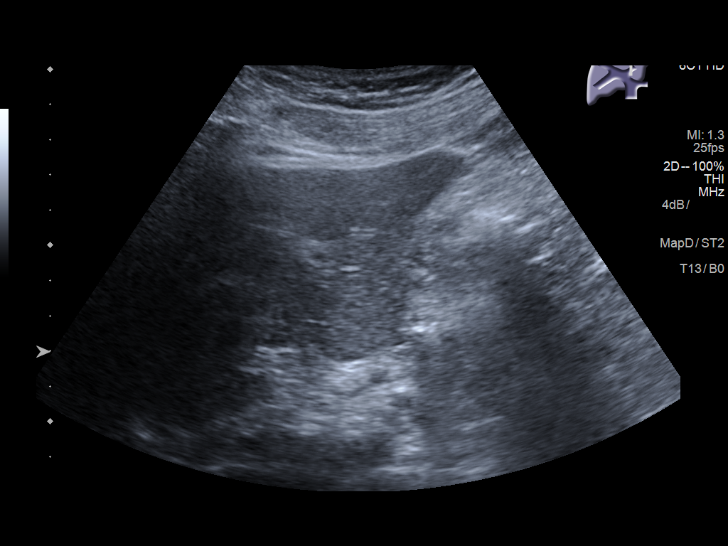
[im 71/113]
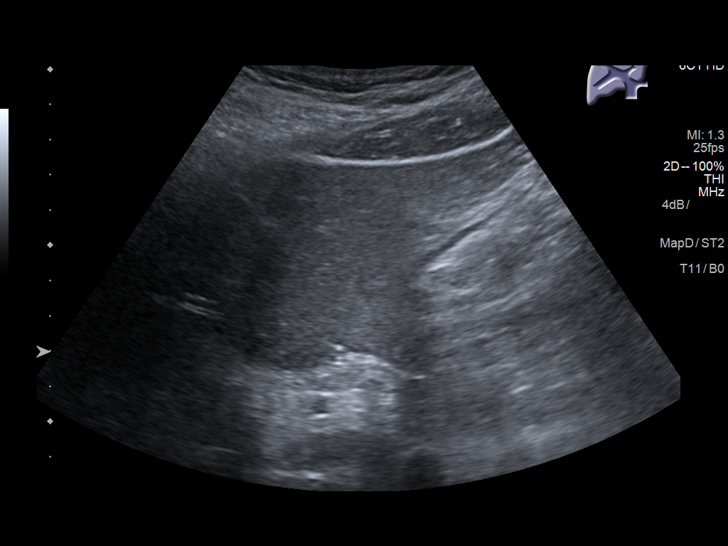
[im 75/113]
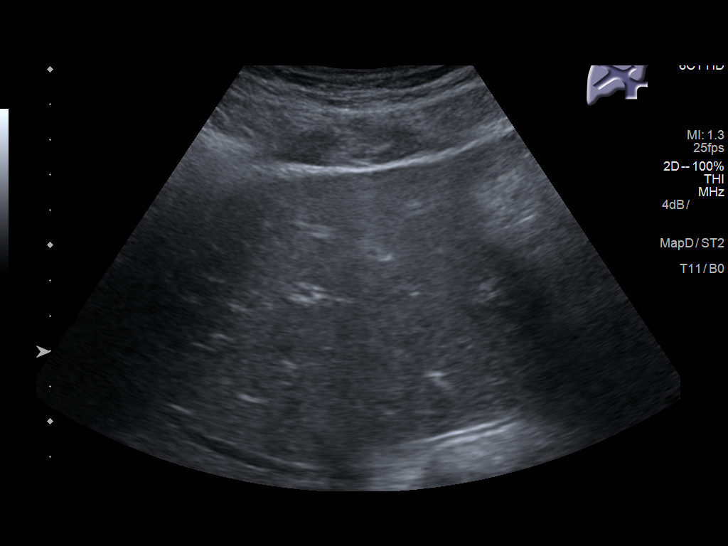
[im 85/113]
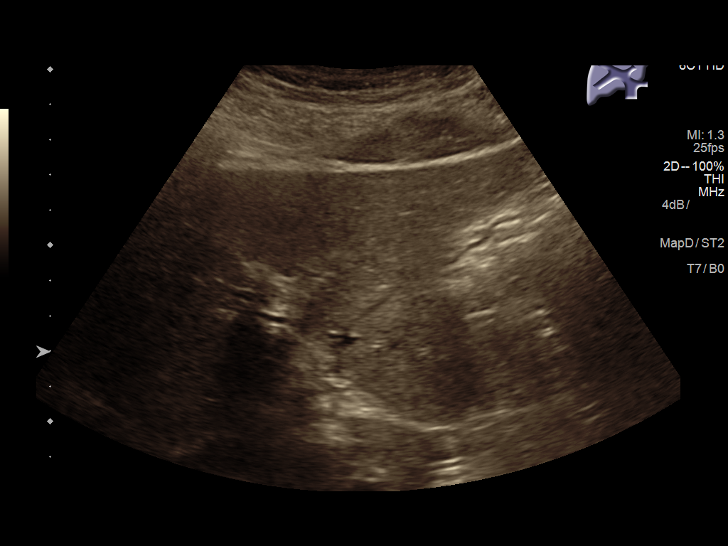
[im 94/113]
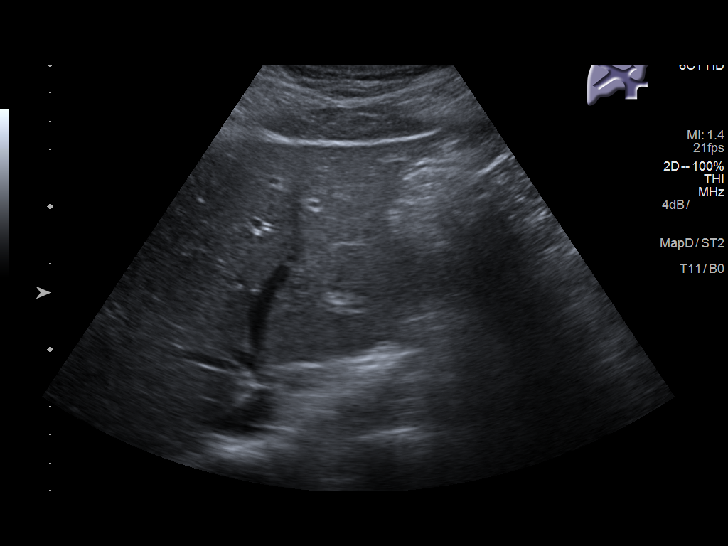
[im 103/113]
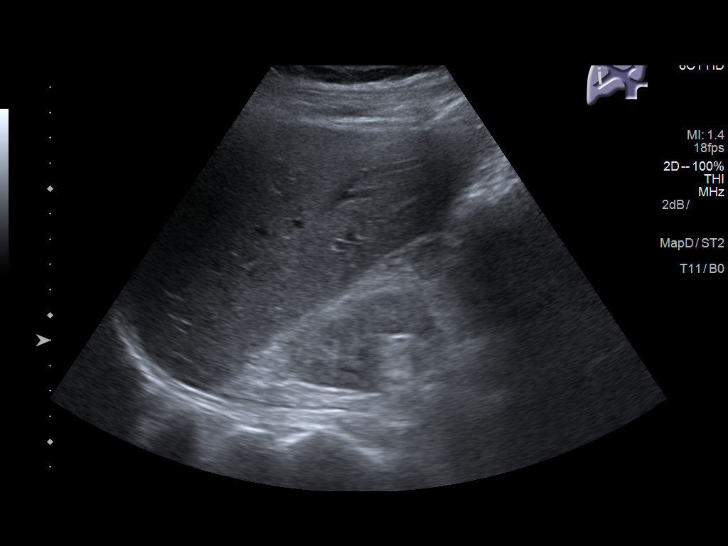
[im 113/113]
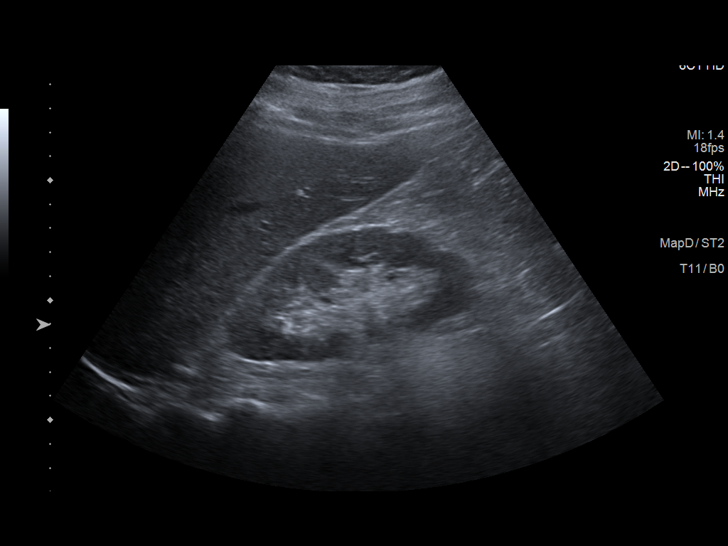

[14 of 25 positions shown; findings below may reference images not displayed]

FINDINGS: Gallbladder:

Gallbladder is well distended. 4 mm gallbladder polyp is noted.
Additionally a large calculus is noted. No wall thickening or
pericholecystic fluid is noted.

Common bile duct:

Diameter: 3.8 mm.

Liver:

Some vague increased echogenicity is noted within the left lobe of
the liver in the generalized area of the abnormality seen on recent
CT. No discrete mass is noted. Portal vein is patent on color
Doppler imaging with normal direction of blood flow towards the
liver.
IMPRESSION: Cholelithiasis and gallbladder polyps.

Previously seen hyperdense lesion on CT is not as well appreciated
on today's ultrasound. Nonemergent MRI is recommended for further
evaluation.
